# Patient Record
Sex: Female | Born: 1995
Health system: Southern US, Community
[De-identification: ages and names within clinical notes are randomized; demographics above are authoritative.]

## PROBLEM LIST (undated history)

## (undated) DIAGNOSIS — F419 Anxiety disorder, unspecified: Secondary | ICD-10-CM

## (undated) DIAGNOSIS — F329 Major depressive disorder, single episode, unspecified: Secondary | ICD-10-CM

## (undated) DIAGNOSIS — F32A Depression, unspecified: Secondary | ICD-10-CM

---
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
## 2017-03-06 ENCOUNTER — Emergency Department
Admission: EM | Admit: 2017-03-06 | Discharge: 2017-03-06 | Disposition: A | Discharge status: Home / Self Care | Payer: No Typology Code available for payment source | Attending: Dermatology | Admitting: Dermatology

## 2017-03-06 DIAGNOSIS — M545 Low back pain: Secondary | ICD-10-CM | POA: Diagnosis present

## 2017-03-06 DIAGNOSIS — R110 Nausea: Secondary | ICD-10-CM | POA: Diagnosis not present

## 2017-03-06 DIAGNOSIS — Z5321 Procedure and treatment not carried out due to patient leaving prior to being seen by health care provider: Secondary | ICD-10-CM | POA: Diagnosis not present

## 2017-03-06 HISTORY — DX: Major depressive disorder, single episode, unspecified: F32.9

## 2017-03-06 HISTORY — DX: Depression, unspecified: F32.A

## 2017-03-06 LAB — URINALYSIS, COMPLETE (UACMP) WITH MICROSCOPIC
BILIRUBIN URINE: NEGATIVE
Glucose, UA: NEGATIVE mg/dL
KETONES UR: NEGATIVE mg/dL
Nitrite: NEGATIVE
PROTEIN: 30 mg/dL — AB
Specific Gravity, Urine: 1.01 (ref 1.005–1.030)
pH: 7 (ref 5.0–8.0)

## 2017-03-06 LAB — POCT PREGNANCY, URINE: PREG TEST UR: NEGATIVE

## 2017-03-06 NOTE — ED Triage Notes (Signed)
Patient c/o lower back pain, and nausea. Patient denies urinary symptoms and possibility of pregnancy.

## 2017-03-08 ENCOUNTER — Telehealth: Payer: Self-pay | Admitting: Emergency Medicine

## 2017-03-08 NOTE — Telephone Encounter (Signed)
Called patient due to lwot to inquire about condition and follow up plans. Left message asking her to call me, as her urine analysis is abnormal and she would be advised to return or see a provider elsewhere.

## 2017-04-09 ENCOUNTER — Emergency Department (HOSPITAL_BASED_OUTPATIENT_CLINIC_OR_DEPARTMENT_OTHER): Payer: No Typology Code available for payment source

## 2017-04-09 ENCOUNTER — Encounter (HOSPITAL_BASED_OUTPATIENT_CLINIC_OR_DEPARTMENT_OTHER): Payer: Self-pay | Admitting: Emergency Medicine

## 2017-04-09 ENCOUNTER — Emergency Department (HOSPITAL_BASED_OUTPATIENT_CLINIC_OR_DEPARTMENT_OTHER)
Admission: EM | Admit: 2017-04-09 | Discharge: 2017-04-09 | Disposition: A | Discharge status: Home / Self Care | Payer: No Typology Code available for payment source | Attending: Emergency Medicine | Admitting: Emergency Medicine

## 2017-04-09 DIAGNOSIS — R109 Unspecified abdominal pain: Secondary | ICD-10-CM

## 2017-04-09 DIAGNOSIS — Z79899 Other long term (current) drug therapy: Secondary | ICD-10-CM | POA: Diagnosis not present

## 2017-04-09 DIAGNOSIS — R319 Hematuria, unspecified: Secondary | ICD-10-CM | POA: Diagnosis not present

## 2017-04-09 DIAGNOSIS — R1012 Left upper quadrant pain: Secondary | ICD-10-CM | POA: Diagnosis present

## 2017-04-09 DIAGNOSIS — N390 Urinary tract infection, site not specified: Secondary | ICD-10-CM | POA: Insufficient documentation

## 2017-04-09 LAB — URINALYSIS, MICROSCOPIC (REFLEX)

## 2017-04-09 LAB — URINALYSIS, ROUTINE W REFLEX MICROSCOPIC
Bilirubin Urine: NEGATIVE
GLUCOSE, UA: NEGATIVE mg/dL
KETONES UR: 15 mg/dL — AB
Nitrite: NEGATIVE
PH: 7.5 (ref 5.0–8.0)
Protein, ur: NEGATIVE mg/dL
Specific Gravity, Urine: 1.013 (ref 1.005–1.030)

## 2017-04-09 MED ORDER — ACETAMINOPHEN 325 MG PO TABS: 650.0000 mg | ORAL_TABLET | Freq: Four times a day (QID) | ORAL | 0 refills | Status: AC | PRN

## 2017-04-09 MED ORDER — CEPHALEXIN 500 MG PO CAPS: 500.0000 mg | ORAL_CAPSULE | Freq: Three times a day (TID) | ORAL | 0 refills | Status: AC

## 2017-04-09 MED ORDER — IBUPROFEN 600 MG PO TABS: 600.0000 mg | ORAL_TABLET | Freq: Four times a day (QID) | ORAL | 0 refills | Status: AC | PRN

## 2017-04-09 MED FILL — CEPHALEXIN 500 MG CAPSULE: 500 | 10 days supply | Qty: 30 | Fill #0

## 2017-04-09 MED FILL — IBUPROFEN 600 MG TABLET: 600 | 8 days supply | Qty: 30 | Fill #0

## 2017-04-09 MED FILL — ACETAMINOPHEN 325 MG TABLET: 325 | 13 days supply | Qty: 100 | Fill #0

## 2017-04-09 NOTE — ED Provider Notes (Signed)
[facility] Provider Note   CSN: [id number] Arrival date & time: 04/09/17  1342     History   Chief Complaint Chief Complaint  Patient presents with  . Flank Pain    HPI Annette Ortiz is a 21 y.o. female.  HPI here for evaluation of left-sided flank pain. Patient reports symptoms started yesterday as a dull left-sided backache. This morning she woke up with sharp intense pain in her left back. She reports associated nausea but denies any vomiting. She denies any fevers or chills, overt urinary symptoms, unusual vaginal bleeding or discharge. Was sent here from urgent care for further evaluation. Was given Tylenol and Zofran in urgent care and experiences significant relief.  Past Medical History:  Diagnosis Date  . Depression     There are no active problems to display for this patient.   History reviewed. No pertinent surgical history.  OB History    Gravida Para Term Preterm AB Living   0 0 0 0 0 0   SAB TAB Ectopic Multiple Live Births   0 0 0 0 0       Home Medications    Prior to Admission medications   Medication Sig Start Date End Date Taking? Authorizing Provider  FLUoxetine (PROZAC) 20 MG capsule Take 20 mg by mouth daily.   Yes [provider]  acetaminophen (TYLENOL) 325 MG tablet Take 2 tablets (650 mg total) by mouth every 6 (six) hours as needed. 04/09/17   Usama Harkless, Sharlet SalinaBenjamin, PA-C  cephALEXin (KEFLEX) 500 MG capsule Take 1 capsule (500 mg total) by mouth 3 (three) times daily. 04/09/17 04/19/17  Joycie Peekartner, Jadier Rockers, PA-C    Family History Family History  Problem Relation Age of Onset  . Seizures Mother     Social History Social History  Substance Use Topics  . Smoking status: Never Smoker  . Smokeless tobacco: Never Used  . Alcohol use No     Allergies   Patient has no known allergies.   Review of Systems Review of Systems See history of present illness  Physical Exam Updated Vital Signs BP 104/64 (BP Location:  Right Arm)   Pulse 89   Temp 99.2 F (37.3 C) (Oral)   Resp 18   Ht 5\' 7"  (1.702 m)   Wt 68 kg (150 lb)   LMP 03/13/2017 (Approximate)   SpO2 100%   BMI 23.49 kg/m   Physical Exam  Constitutional: She appears well-developed. No distress.  Awake, alert and nontoxic in appearance  HENT:  Head: Normocephalic and atraumatic.  Right Ear: External ear normal.  Left Ear: External ear normal.  Mouth/Throat: Oropharynx is clear and moist.  Eyes: Conjunctivae and EOM are normal. Pupils are equal, round, and reactive to light.  Neck: Normal range of motion. No JVD present.  Cardiovascular: Normal rate, regular rhythm and normal heart sounds.   Pulmonary/Chest: Effort normal and breath sounds normal. No stridor.  Abdominal: Soft. There is tenderness.  Mild tenderness in left lower quadrant, somewhat diffusely about left flank. Minimal left CVA tenderness. No rebound or guarding. No peritoneal signs.  Musculoskeletal: Normal range of motion.  Neurological:  Awake, alert, cooperative and aware of situation; motor strength bilaterally; sensation normal to light touch bilaterally; no facial asymmetry; tongue midline; major cranial nerves appear intact;  baseline gait without new ataxia.  Skin: No rash noted. She is not diaphoretic.  Psychiatric: She has a normal mood and affect. Her behavior is normal. Thought content normal.  Nursing note and vitals  reviewed.    ED Treatments / Results  Labs (all labs ordered are listed, but only abnormal results are displayed) Labs Reviewed  URINALYSIS, ROUTINE W REFLEX MICROSCOPIC - Abnormal; Notable for the following:       Result Value   APPearance CLOUDY (*)    Hgb urine dipstick TRACE (*)    Ketones, ur 15 (*)    Leukocytes, UA MODERATE (*)    All other components within normal limits  URINALYSIS, MICROSCOPIC (REFLEX) - Abnormal; Notable for the following:    Bacteria, UA MANY (*)    Squamous Epithelial / LPF 0-5 (*)    All other components  within normal limits  URINE CULTURE    EKG  EKG Interpretation None       Radiology US Renal  Result Date: 04/09/2017 CLINICAL DATA:  21 year old female with acute left flank pain for 1 day. EXAM: RENAL / URINARY TRACT ULTRASOUND COMPLETE COMPARISON:  None. FINDINGS: Right Kidney: Length: 10.1 cm. Echogenicity within normal limits. No mass or hydronephrosis visualized. Left Kidney: Length: 11.2 cm. Echogenicity within normal limits. No mass or hydronephrosis visualized. Bladder: Appears normal for degree of bladder distention. Normal bilateral ureteral jets are identified. IMPRESSION: Normal renal ultrasound. Electronically Signed   By: Harmon Pier M.D.   On: 04/09/2017 16:16    Procedures Procedures (including critical care time)  Medications Ordered in ED Medications - No data to display   Initial Impression / Assessment and Plan / ED Course  I have reviewed the triage vital signs and the nursing notes.  Pertinent labs & imaging results that were available during my care of the patient were reviewed by me and considered in my medical decision making (see chart for details).     Patient sent from urgent care for evaluation of left flank pain. On exam she does have mild left CVA tenderness and some tenderness to palpation and left flank. Urinalysis shows evidence of infection. Pregnancy test completed at urgent care was negative today. Discussed risks and benefits associated with CT scan versus ultrasound with mom and patient, they prefer ultrasound at this time. Renal ultrasound is negative for any stone or other emergent pathology. Will treat empirically for possible pyelonephritis with Keflex 10 days. Urine culture obtained. Overall she appears very well, nontoxic, hemodynamically stable and appropriate for outpatient follow-up. She voices no other questions or concerns prior to discharge. Discussed follow-up with PCP. Return precautions discussed.  Final Clinical Impressions(s) /  ED Diagnoses   Final diagnoses:  Left flank pain  Urinary tract infection with hematuria, site unspecified    New Prescriptions New Prescriptions   ACETAMINOPHEN (TYLENOL) 325 MG TABLET    Take 2 tablets (650 mg total) by mouth every 6 (six) hours as needed.   CEPHALEXIN (KEFLEX) 500 MG CAPSULE    Take 1 capsule (500 mg total) by mouth 3 (three) times daily.   IBUPROFEN (ADVIL,MOTRIN) 600 MG TABLET    Take 1 tablet (600 mg total) by mouth every 6 (six) hours as needed.     Joycie Peek, PA-C 04/09/17 1638    Melene Plan, DO 04/10/17 [phone]

## 2017-04-09 NOTE — ED Triage Notes (Addendum)
Patient reports left flank pain since yesterday afternoon.  Reports hematuria, nausea. Denies dysuria.  Referred from [facility] urgent care for CT scan.  Received 4mg  zofran ODT and 1000mg  tylenol PO from urgent care.

## 2017-04-09 NOTE — Discharge Instructions (Signed)
Your evaluation in the emergency Department is complete. Your symptoms are likely due to a UTI. There is no evidence of kidney stone. Please take your antibiotics as prescribed. Take all of your antibiotics and do not save or share them. You may continue taking Tylenol and ibuprofen to help with her symptoms. Be sure to drink lots of fluids as we discussed. Follow up with your doctor for reevaluation. Return to ED for new or worsening symptoms as we discussed.

## 2017-04-10 LAB — URINE CULTURE

## 2017-04-11 ENCOUNTER — Encounter (HOSPITAL_BASED_OUTPATIENT_CLINIC_OR_DEPARTMENT_OTHER): Payer: Self-pay | Admitting: Emergency Medicine

## 2017-04-11 ENCOUNTER — Telehealth: Payer: Self-pay

## 2017-04-11 ENCOUNTER — Emergency Department (HOSPITAL_BASED_OUTPATIENT_CLINIC_OR_DEPARTMENT_OTHER): Payer: No Typology Code available for payment source

## 2017-04-11 ENCOUNTER — Emergency Department (HOSPITAL_BASED_OUTPATIENT_CLINIC_OR_DEPARTMENT_OTHER)
Admission: EM | Admit: 2017-04-11 | Discharge: 2017-04-12 | Disposition: A | Discharge status: Home / Self Care | Payer: No Typology Code available for payment source | Attending: Emergency Medicine | Admitting: Emergency Medicine

## 2017-04-11 DIAGNOSIS — N12 Tubulo-interstitial nephritis, not specified as acute or chronic: Secondary | ICD-10-CM

## 2017-04-11 DIAGNOSIS — N200 Calculus of kidney: Secondary | ICD-10-CM | POA: Insufficient documentation

## 2017-04-11 DIAGNOSIS — R1110 Vomiting, unspecified: Secondary | ICD-10-CM | POA: Diagnosis present

## 2017-04-11 LAB — CBC WITH DIFFERENTIAL/PLATELET
BASOS ABS: 0 10*3/uL (ref 0.0–0.1)
BASOS PCT: 0 %
Eosinophils Absolute: 0 10*3/uL (ref 0.0–0.7)
Eosinophils Relative: 0 %
HCT: 37.4 % (ref 36.0–46.0)
Hemoglobin: 12.8 g/dL (ref 12.0–15.0)
Lymphocytes Relative: 9 %
Lymphs Abs: 0.8 10*3/uL (ref 0.7–4.0)
MCH: 29.6 pg (ref 26.0–34.0)
MCHC: 34.2 g/dL (ref 30.0–36.0)
MCV: 86.4 fL (ref 78.0–100.0)
MONO ABS: 0.7 10*3/uL (ref 0.1–1.0)
Monocytes Relative: 7 %
NEUTROS ABS: 8.1 10*3/uL — AB (ref 1.7–7.7)
Neutrophils Relative %: 84 %
PLATELETS: 226 10*3/uL (ref 150–400)
RBC: 4.33 MIL/uL (ref 3.87–5.11)
RDW: 12.9 % (ref 11.5–15.5)
WBC: 9.6 10*3/uL (ref 4.0–10.5)

## 2017-04-11 LAB — URINALYSIS, ROUTINE W REFLEX MICROSCOPIC
Bilirubin Urine: NEGATIVE
GLUCOSE, UA: NEGATIVE mg/dL
Hgb urine dipstick: NEGATIVE
Ketones, ur: >80 mg/dL — AB
Nitrite: NEGATIVE
PH: 6.5 (ref 5.0–8.0)
PROTEIN: NEGATIVE mg/dL
SPECIFIC GRAVITY, URINE: 1.011 (ref 1.005–1.030)

## 2017-04-11 LAB — COMPREHENSIVE METABOLIC PANEL
ALBUMIN: 3.7 g/dL (ref 3.5–5.0)
ALT: 18 U/L (ref 14–54)
AST: 21 U/L (ref 15–41)
Alkaline Phosphatase: 68 U/L (ref 38–126)
Anion gap: 12 (ref 5–15)
BUN: 9 mg/dL (ref 6–20)
CHLORIDE: 99 mmol/L — AB (ref 101–111)
CO2: 21 mmol/L — AB (ref 22–32)
Calcium: 8.9 mg/dL (ref 8.9–10.3)
Creatinine, Ser: 0.68 mg/dL (ref 0.44–1.00)
GFR calc Af Amer: >60 mL/min (ref >60)
GFR calc non Af Amer: >60 mL/min (ref >60)
GLUCOSE: 81 mg/dL (ref 65–99)
POTASSIUM: 3.7 mmol/L (ref 3.5–5.1)
Sodium: 132 mmol/L — ABNORMAL LOW (ref 135–145)
Total Bilirubin: 0.7 mg/dL (ref 0.3–1.2)
Total Protein: 7.2 g/dL (ref 6.5–8.1)

## 2017-04-11 LAB — LIPASE, BLOOD: Lipase: 15 U/L (ref 11–51)

## 2017-04-11 LAB — URINALYSIS, MICROSCOPIC (REFLEX): RBC / HPF: NONE SEEN RBC/hpf (ref 0–5)

## 2017-04-11 LAB — PREGNANCY, URINE: Preg Test, Ur: NEGATIVE

## 2017-04-11 MED ORDER — CIPROFLOXACIN IN D5W 400 MG/200ML IV SOLN
400.0000 mg | Freq: Once | INTRAVENOUS | Status: AC
Start: 2017-04-11 — End: 2017-04-11
  Administered 2017-04-11: 400 mg via INTRAVENOUS
  Filled 2017-04-11: qty 200

## 2017-04-11 MED ORDER — ONDANSETRON HCL 4 MG/2ML IJ SOLN
4.0000 mg | Freq: Once | INTRAMUSCULAR | Status: AC
  Administered 2017-04-11: 4 mg via INTRAVENOUS
  Filled 2017-04-11: qty 2

## 2017-04-11 MED ORDER — IOPAMIDOL (ISOVUE-300) INJECTION 61%
100.0000 mL | Freq: Once | INTRAVENOUS | Status: AC | PRN
  Administered 2017-04-11: 100 mL via INTRAVENOUS

## 2017-04-11 MED ORDER — CIPROFLOXACIN HCL 500 MG PO TABS: 500.0000 mg | ORAL_TABLET | Freq: Two times a day (BID) | ORAL | 0 refills | Status: DC

## 2017-04-11 MED ORDER — KETOROLAC TROMETHAMINE 15 MG/ML IJ SOLN
15.0000 mg | Freq: Once | INTRAMUSCULAR | Status: AC
Start: 2017-04-11 — End: 2017-04-11
  Administered 2017-04-11: 15 mg via INTRAVENOUS
  Filled 2017-04-11: qty 1

## 2017-04-11 MED ORDER — LIDOCAINE 4 % EX CREA
TOPICAL_CREAM | Freq: Once | CUTANEOUS | Status: AC
  Administered 2017-04-11: 20:00:00 via TOPICAL
  Filled 2017-04-11: qty 5

## 2017-04-11 MED ORDER — SODIUM CHLORIDE 0.9 % IV BOLUS (SEPSIS)
1000.0000 mL | Freq: Once | INTRAVENOUS | Status: AC
  Administered 2017-04-11: 1000 mL via INTRAVENOUS

## 2017-04-11 MED ORDER — TRAMADOL HCL 50 MG PO TABS: 50.0000 mg | ORAL_TABLET | Freq: Four times a day (QID) | ORAL | 0 refills | Status: AC | PRN

## 2017-04-11 MED ORDER — ONDANSETRON HCL 8 MG PO TABS: 8.0000 mg | ORAL_TABLET | Freq: Three times a day (TID) | ORAL | 0 refills | Status: AC | PRN

## 2017-04-11 NOTE — ED Triage Notes (Signed)
Patient states that shw was treated her on firday and has not had any vomiting since the zofran on Friday. The patient reports that she started to have vomiting again today. Patient is having darker foul smelling stools. She has pain to her left ear

## 2017-04-11 NOTE — ED Provider Notes (Signed)
[facility] Provider Note   CSN: [id number] Arrival date & time: 04/11/17  1833  By signing my name below, I, Linna DarnerRussell Turner, attest that this documentation has been prepared under the direction and in the presence of Karlis Cregg, PA-C. Electronically Signed: Linna Darnerussell Turner, Scribe. 04/11/2017. 7:39 PM.  History   Chief Complaint Chief Complaint  Patient presents with  . Emesis   The history is provided by the patient. No language interpreter was used.    HPI Comments: Annette Ortiz is a 21 y.o. female who presents to the Emergency Department complaining of constant, waxing and waning left flank pain beginning two days ago. She reports some associated nausea. Patient was evaluated here two days ago for the same and was discharged with Tylenol, Motrin, and Keflex which have not improved her symptoms. Her urine culture from the visit on 7/6 was contaminated and her renal ultrasound was normal. This morning, patient developed some persistent green-tinted vomiting and has been unable to tolerate PO intake. She also notes that her bowel movements have been very dark and unusually malodorous today. She takes fluoxetine for depression but has been non-compliant lately. Patient denies urinary retention, dysuria, fevers, chills, or any other associated symptoms.  Past Medical History:  Diagnosis Date  . Depression     There are no active problems to display for this patient.   History reviewed. No pertinent surgical history.  OB History    Gravida Para Term Preterm AB Living   0 0 0 0 0 0   SAB TAB Ectopic Multiple Live Births   0 0 0 0 0       Home Medications    Prior to Admission medications   Medication Sig Start Date End Date Taking? Authorizing Provider  acetaminophen (TYLENOL) 325 MG tablet Take 2 tablets (650 mg total) by mouth every 6 (six) hours as needed. 04/09/17   Cartner, Sharlet SalinaBenjamin, PA-C  cephALEXin (KEFLEX) 500 MG capsule Take 1 capsule (500 mg total)  by mouth 3 (three) times daily. 04/09/17 04/19/17  Cartner, Sharlet SalinaBenjamin, PA-C  FLUoxetine (PROZAC) 20 MG capsule Take 20 mg by mouth daily.    [provider]  ibuprofen (ADVIL,MOTRIN) 600 MG tablet Take 1 tablet (600 mg total) by mouth every 6 (six) hours as needed. 04/09/17   Joycie Peekartner, Benjamin, PA-C    Family History Family History  Problem Relation Age of Onset  . Seizures Mother     Social History Social History  Substance Use Topics  . Smoking status: Never Smoker  . Smokeless tobacco: Never Used  . Alcohol use No     Allergies   Patient has no known allergies.   Review of Systems Review of Systems  Constitutional: Negative for chills and fever.  Gastrointestinal: Positive for abdominal pain, nausea and vomiting.       Positive for melena.  Genitourinary: Positive for flank pain (L). Negative for difficulty urinating, dysuria, hematuria, vaginal bleeding, vaginal discharge and vaginal pain.  All other systems reviewed and are negative.  Physical Exam Updated Vital Signs BP (!) 104/34 (BP Location: Right Arm)   Pulse 96   Temp 99.3 F (37.4 C) (Oral)   Resp 18   LMP 03/13/2017 (Approximate)   SpO2 99%   Physical Exam  Constitutional: She is oriented to person, place, and time. She appears well-developed and well-nourished. No distress.  HENT:  Head: Normocephalic and atraumatic.  Eyes: Conjunctivae and EOM are normal.  Neck: Neck supple. No tracheal deviation present.  Cardiovascular:  Normal rate.   Pulmonary/Chest: Effort normal. No respiratory distress.  Abdominal: Soft. She exhibits no distension and no mass. There is tenderness. There is no guarding.  Left CVA tenderness  Musculoskeletal: Normal range of motion.  Neurological: She is alert and oriented to person, place, and time.  Skin: Skin is warm and dry.  Psychiatric: She has a normal mood and affect. Her behavior is normal.  Nursing note and vitals reviewed.  ED Treatments / Results  Labs (all  labs ordered are listed, but only abnormal results are displayed) Labs Reviewed  CBC WITH DIFFERENTIAL/PLATELET - Abnormal; Notable for the following:       Result Value   Neutro Abs 8.1 (*)    All other components within normal limits  COMPREHENSIVE METABOLIC PANEL - Abnormal; Notable for the following:    Sodium 132 (*)    Chloride 99 (*)    CO2 21 (*)    All other components within normal limits  URINALYSIS, ROUTINE W REFLEX MICROSCOPIC - Abnormal; Notable for the following:    Ketones, ur >80 (*)    Leukocytes, UA TRACE (*)    All other components within normal limits  URINALYSIS, MICROSCOPIC (REFLEX) - Abnormal; Notable for the following:    Bacteria, UA RARE (*)    Squamous Epithelial / LPF 0-5 (*)    All other components within normal limits  URINE CULTURE  LIPASE, BLOOD  PREGNANCY, URINE    EKG  EKG Interpretation None       Radiology Ct Abdomen Pelvis W Contrast  Result Date: 04/11/2017 CLINICAL DATA:  Vomiting left flank pain EXAM: CT ABDOMEN AND PELVIS WITH CONTRAST TECHNIQUE: Multidetector CT imaging of the abdomen and pelvis was performed using the standard protocol following bolus administration of intravenous contrast. CONTRAST:  ISOVUE-300 IOPAMIDOL (ISOVUE-300) INJECTION 61% COMPARISON:  None. FINDINGS: Lower chest: Lung bases demonstrate no acute consolidation or pleural effusion. Normal heart size. Hepatobiliary: No focal liver abnormality is seen. No gallstones, gallbladder wall thickening, or biliary dilatation. Pancreas: Unremarkable. No pancreatic ductal dilatation or surrounding inflammatory changes. Spleen: Normal in size without focal abnormality. Adrenals/Urinary Tract: Adrenal glands are within normal limits. The right kidney is unremarkable. Patchy/ striated nephrogram on the left with multifocal cortical hypodensities in the left kidney. No hydronephrosis. The bladder is normal. Stomach/Bowel: Stomach is within normal limits. Appendix appears  normal. No evidence of bowel wall thickening, distention, or inflammatory changes. Vascular/Lymphatic: Non aneurysmal aorta. Subcentimeter right lower quadrant mesenteric lymph nodes Reproductive: Prominent endometrial stripe which may be correlated with phase of menses. No adnexal masses Other: Trace free fluid in the pelvis.  No free air. Musculoskeletal: No acute or significant osseous findings. IMPRESSION: 1. Patchy cortical hypodensities within the left kidney, CT appearance is suggestive of pyelonephritis. Suggest correlation with urinalysis. 2. Negative for bowel obstruction or bowel wall thickening. Normal appendix. 3. Small amount of free fluid in the pelvis Electronically Signed   By: Jasmine Pang M.D.   On: 04/11/2017 22:02    Procedures Procedures (including critical care time)  DIAGNOSTIC STUDIES: Oxygen Saturation is 99% on RA, normal by my interpretation.    COORDINATION OF CARE: 7:39 PM Discussed treatment plan with pt at bedside and pt agreed to plan.  Medications Ordered in ED Medications - No data to display   Initial Impression / Assessment and Plan / ED Course  I have reviewed the triage vital signs and the nursing notes.  Pertinent labs & imaging results that were available during my care  of the patient were reviewed by me and considered in my medical decision making (see chart for details).     Pt in ED with continued left flank pain, now with n/v. Exam is positive for left upper quadrant tenderness and left CVA tenderness. She is afebrile, vital signs are normal. Will check labs, urinalysis, give Zofran IV fluids.   Patient's labs with no significant abnormality. Urinalysis showing improving infection. Recent cultures from 2 days ago showed multiple organisms, no predominant. We will send culture of today urine as well. We will get CT abdomen and pelvis for further evaluation.   Patient's CT scan did show left pyelonephritis. This is consistent with her history  and exam. Will switch to Cipro, given a dose of IV Cipro in emergency department. We will give an oral trial.   Pt tolerating oral fluids with no difficulty. Feels much better.  Plan to dc home. Pt requesting stronger medication for pain, currently taking tylenol. Will give tramadol. Will have her follow up with pcp. Return precautions discussed.   Vitals:   04/11/17 2245 04/11/17 2300 04/11/17 2315 04/11/17 2330  BP: (!) 102/57 (!) 104/53 100/66 101/63  Pulse: 64 79 80 (!) 55  Resp:      Temp:      TempSrc:      SpO2: 100% 100% 100% 100%    Final Clinical Impressions(s) / ED Diagnoses   Final diagnoses:  Pyelonephritis    New Prescriptions New Prescriptions   CIPROFLOXACIN (CIPRO) 500 MG TABLET    Take 1 tablet (500 mg total) by mouth 2 (two) times daily.   ONDANSETRON (ZOFRAN) 8 MG TABLET    Take 1 tablet (8 mg total) by mouth every 8 (eight) hours as needed for nausea or vomiting.   TRAMADOL (ULTRAM) 50 MG TABLET    Take 1 tablet (50 mg total) by mouth every 6 (six) hours as needed.   I personally performed the services described in this documentation, which was scribed in my presence. The recorded information has been reviewed and is accurate.     Jaynie Crumble, PA-C 04/11/17 2359    Jacalyn Lefevre, MD 04/12/17 [phone]

## 2017-04-11 NOTE — ED Notes (Signed)
EDPA into room, prior to RN assessment, see PA notes, pending orders.   

## 2017-04-11 NOTE — ED Notes (Signed)
Alert, NAD, calm, interactive, resps e/u, speaking in clear complete sentences, no dyspnea noted, skin W&D[facility]  Pt also anxious and beginning to hyperventilate about the thought of IV, blood draw and needle. Encouraged, reassured, procedure explained, attempted coaching and calming measures. Lidocaine cream placed to pt's IV site of choice.

## 2017-04-11 NOTE — Telephone Encounter (Signed)
No further treatment for [facility] ED 04/09/17 per [place] HunAllison Masters Pharm D

## 2017-04-11 NOTE — ED Notes (Signed)
C/o pain and nausea returning.

## 2017-04-11 NOTE — ED Notes (Signed)
Alert, NAD, calm, interactive, resps e/u, speaking in clear complete sentences, no dyspnea noted, skin W&D, VSS, c/o HA, back and abd pain, also nausea, (denies: sob, dizziness or visual changes). [facility]

## 2017-04-11 NOTE — Discharge Instructions (Signed)
Cirpo as prescribed until all gone for infection. Take zofran as prescribed as needed for nausea and vomiting. Take ibuprofen or tylenol for mild pain. Take tramadol for severe pain. Follow up with family doctor to make sure you are improving in 1 week. Return if worsening or cannot keep medications down.
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
## 2017-04-11 NOTE — ED Notes (Signed)
Back from CT. No changes.

## 2017-04-13 LAB — URINE CULTURE: Culture: NO GROWTH

## 2018-03-08 ENCOUNTER — Encounter

## 2019-05-15 ENCOUNTER — Ambulatory Visit
Admission: EM | Admit: 2019-05-15 | Discharge: 2019-05-15 | Disposition: A | Discharge status: Home / Self Care | Payer: No Typology Code available for payment source | Attending: Family Medicine | Admitting: Family Medicine

## 2019-05-15 ENCOUNTER — Ambulatory Visit: Payer: No Typology Code available for payment source

## 2019-05-15 ENCOUNTER — Encounter: Payer: Self-pay | Admitting: Emergency Medicine

## 2019-05-15 ENCOUNTER — Other Ambulatory Visit: Payer: Self-pay

## 2019-05-15 DIAGNOSIS — R51 Headache: Secondary | ICD-10-CM | POA: Insufficient documentation

## 2019-05-15 DIAGNOSIS — S39012A Strain of muscle, fascia and tendon of lower back, initial encounter: Secondary | ICD-10-CM

## 2019-05-15 DIAGNOSIS — S161XXA Strain of muscle, fascia and tendon at neck level, initial encounter: Secondary | ICD-10-CM | POA: Diagnosis not present

## 2019-05-15 DIAGNOSIS — R519 Headache, unspecified: Secondary | ICD-10-CM

## 2019-05-15 HISTORY — DX: Anxiety disorder, unspecified: F41.9

## 2019-05-15 LAB — PREGNANCY, URINE: Preg Test, Ur: NEGATIVE
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
## 2019-07-06 ENCOUNTER — Other Ambulatory Visit: Payer: Self-pay

## 2019-07-06 DIAGNOSIS — Z20822 Contact with and (suspected) exposure to covid-19: Secondary | ICD-10-CM

## 2019-07-07 LAB — NOVEL CORONAVIRUS, NAA: SARS-CoV-2, NAA: DETECTED — AB

## 2020-10-19 IMAGING — CR LUMBAR SPINE - COMPLETE 4+ VIEW
5 series · 5 of 5 positions shown · non-contrast
Comparison: None.

CLINICAL DATA: Pain

EXAM:
LUMBAR SPINE - COMPLETE 4+ VIEW

[l-spine ap]
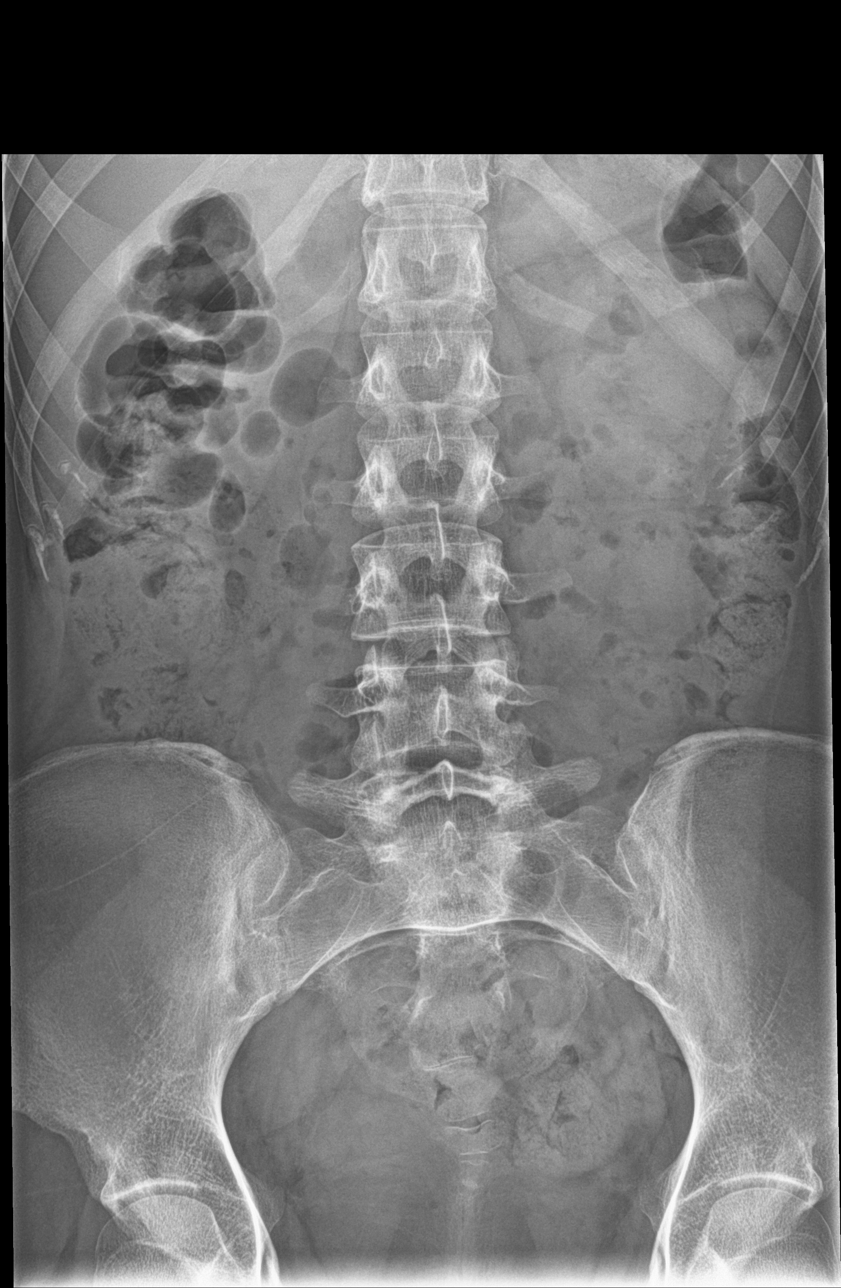

[l-spine obl (1 of 2)]
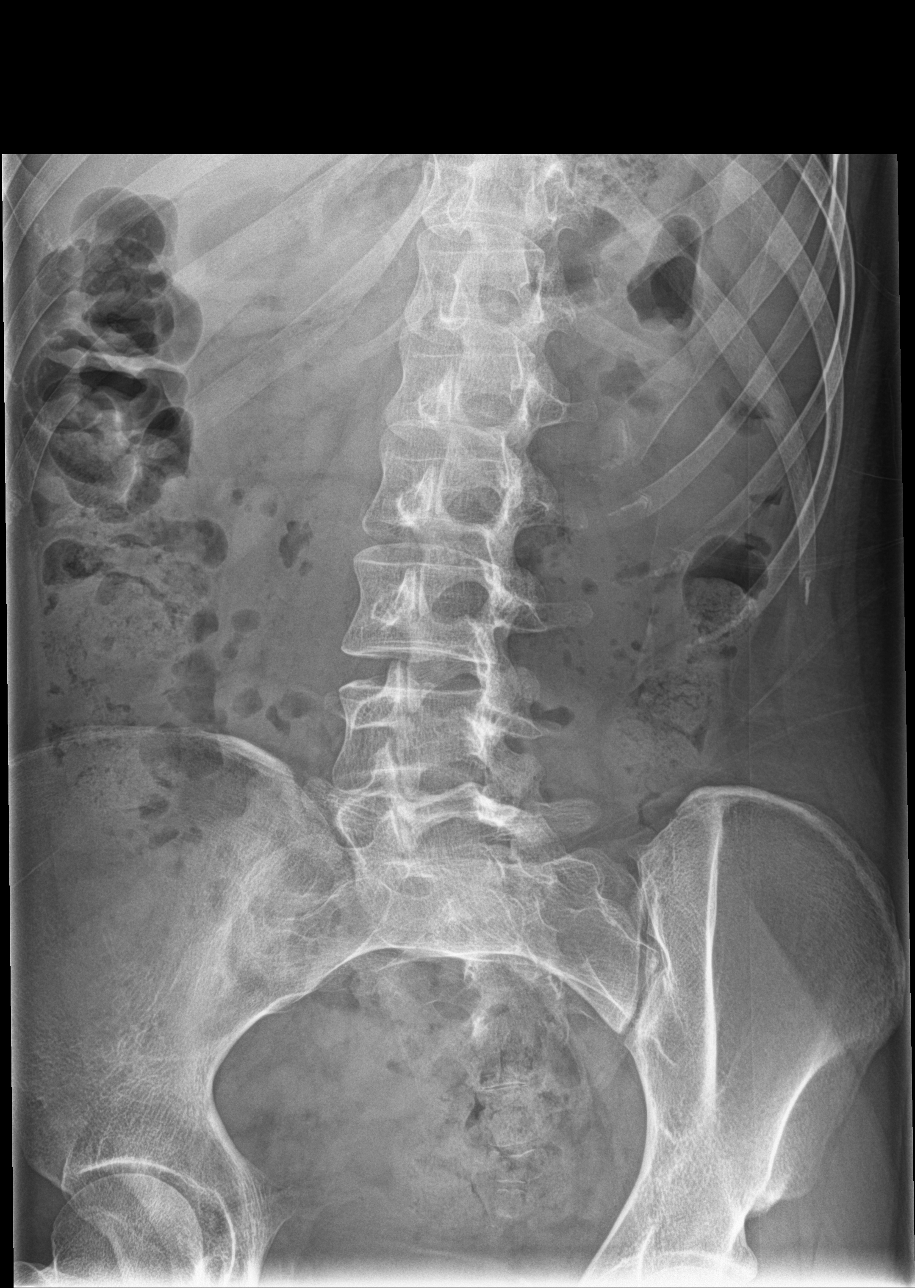

[l-spine obl (2 of 2)]
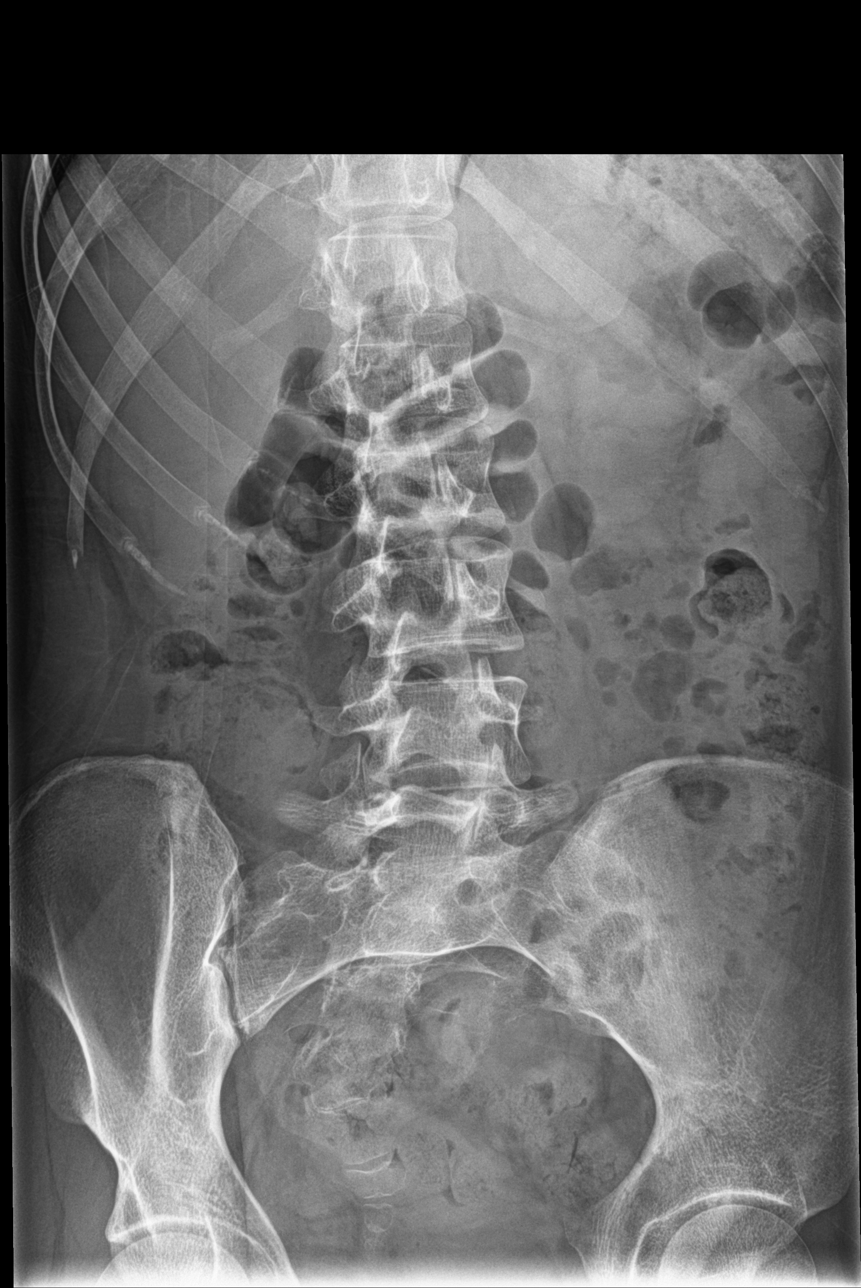

[l-spine lat]
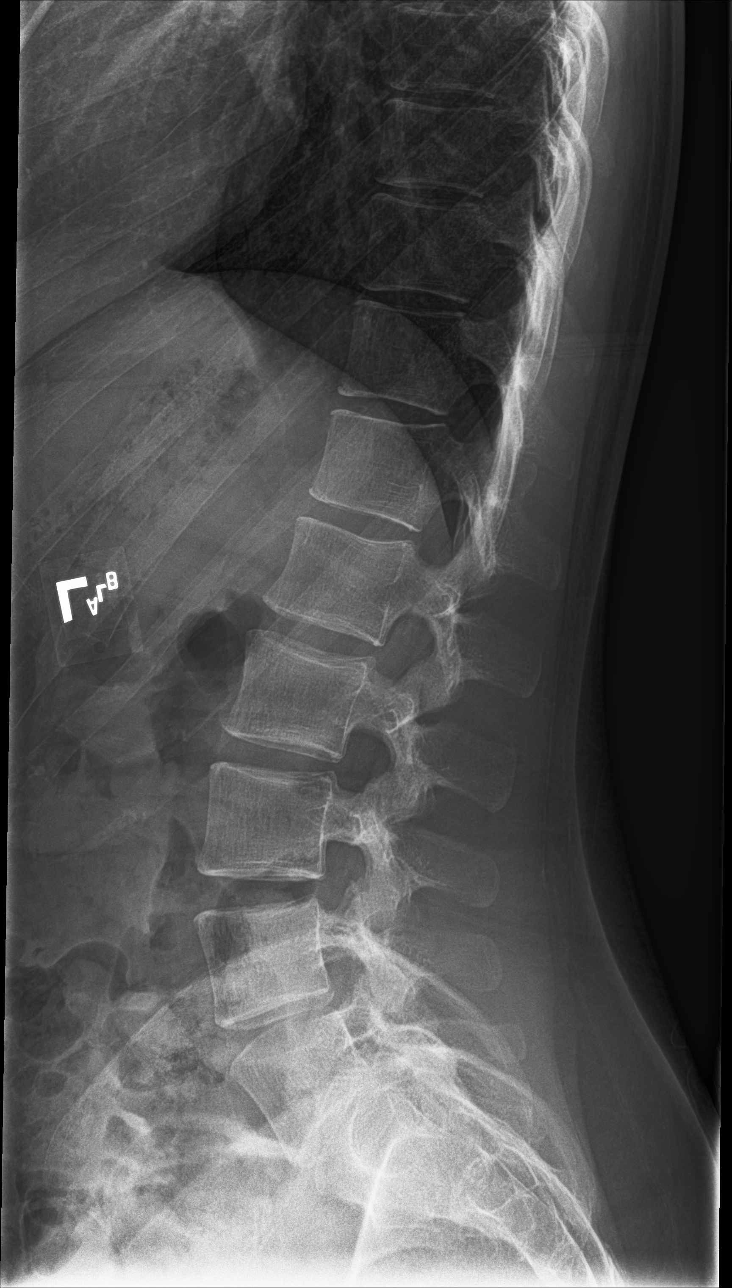

[l-spine spot]
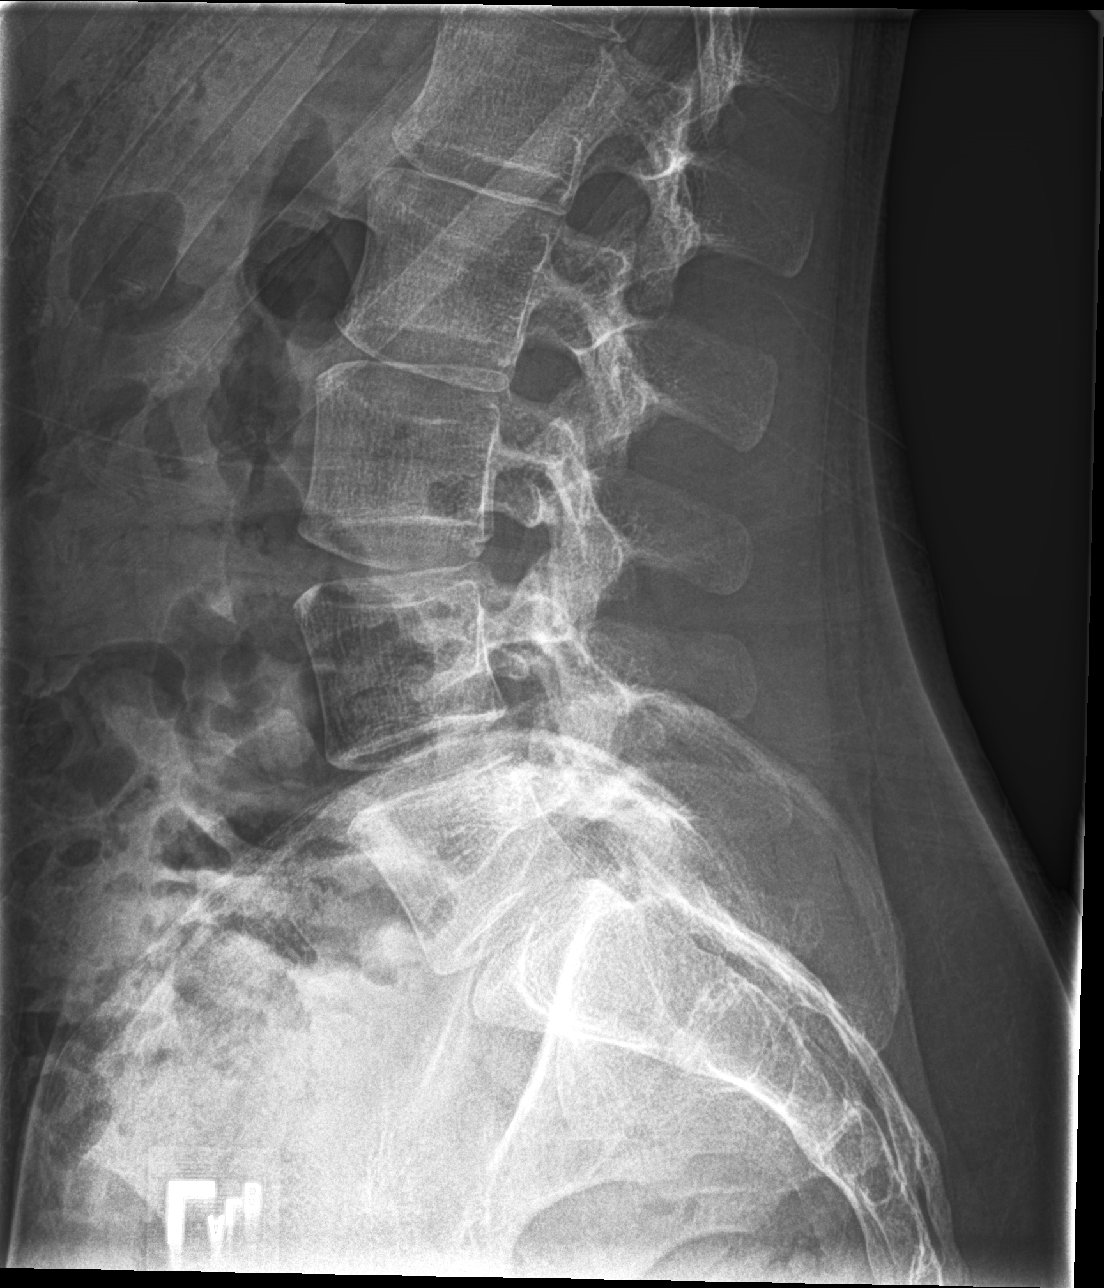

[5 of 5 positions shown; findings below may reference images not displayed]

FINDINGS: There is no evidence of lumbar spine fracture. Alignment is normal.
Intervertebral disc spaces are maintained.
IMPRESSION: Negative.

## 2021-09-19 ENCOUNTER — Emergency Department
Admission: EM | Admit: 2021-09-19 | Discharge: 2021-09-19 | Disposition: A | Discharge status: Home / Self Care | Payer: Medicaid Other | Attending: Emergency Medicine | Admitting: Emergency Medicine

## 2021-09-19 ENCOUNTER — Other Ambulatory Visit: Payer: Self-pay

## 2021-09-19 DIAGNOSIS — J069 Acute upper respiratory infection, unspecified: Secondary | ICD-10-CM | POA: Insufficient documentation

## 2021-09-19 DIAGNOSIS — H66002 Acute suppurative otitis media without spontaneous rupture of ear drum, left ear: Secondary | ICD-10-CM | POA: Insufficient documentation

## 2021-09-19 DIAGNOSIS — L0291 Cutaneous abscess, unspecified: Secondary | ICD-10-CM

## 2021-09-19 DIAGNOSIS — N764 Abscess of vulva: Secondary | ICD-10-CM | POA: Insufficient documentation

## 2021-09-19 MED ORDER — CEFDINIR 300 MG PO CAPS
300.0000 mg | ORAL_CAPSULE | Freq: Two times a day (BID) | ORAL | Status: DC
  Administered 2021-09-19: 300 mg via ORAL
  Filled 2021-09-19: qty 1

## 2021-09-19 MED ORDER — IBUPROFEN 800 MG PO TABS
800.0000 mg | ORAL_TABLET | Freq: Once | ORAL | Status: DC
  Filled 2021-09-19: qty 1

## 2021-09-19 MED ORDER — CEFDINIR 300 MG PO CAPS: 300.0000 mg | ORAL_CAPSULE | Freq: Two times a day (BID) | ORAL | 0 refills | Status: AC

## 2021-09-19 MED ORDER — ACETAMINOPHEN 500 MG PO TABS
1000.0000 mg | ORAL_TABLET | Freq: Once | ORAL | Status: AC
  Administered 2021-09-19: 1000 mg via ORAL
  Filled 2021-09-19: qty 2

## 2021-09-19 NOTE — Discharge Instructions (Signed)
Apply warm compress or soak the abscess in warm water several times a day. Then apply topical bacitracin over it. Take antibiotics as prescribed. Ibuprofen 800mg  every 6 hours for pain.  Return to the emergency room for fever, increased size of the abscess, worsening ear pain, drainage from the ear.  Otherwise follow-up with primary care doctor in 3 days

## 2021-09-19 NOTE — ED Triage Notes (Signed)
Patient ambulatory to triage with steady gait, without difficulty, tearful; st awoke with left earache; recent cold symptoms

## 2021-09-19 NOTE — ED Provider Notes (Signed)
[facility] Emergency Department Provider Note  ____________________________________________  Time seen: Approximately 5:19 AM  I have reviewed the triage vital signs and the nursing notes.   HISTORY  Chief Complaint Otalgia   HPI Annette Ortiz is a 25 y.o. female who presents for evaluation of left ear pain.  Patient reports that she has had a cough and congestion for the last 2 weeks.  This morning she woke up with severe sharp stabbing left ear pain.  Patient is accompanied by her mother who says patient has had several prior infections in her ear as a child including perforation of her tympanic membrane.  The mom was concerned that she had perforated her tympanic membrane again.  Patient did not take anything at home for the pain.  She is also complaining of a vaginal boil that she has had since yesterday.  No fever or chills.  Past Medical History:  Diagnosis Date   Anxiety    Depression     There are no problems to display for this patient.   History reviewed. No pertinent surgical history.  Prior to Admission medications   Medication Sig Start Date End Date Taking? Authorizing Provider  acetaminophen (TYLENOL) 325 MG tablet Take 2 tablets (650 mg total) by mouth every 6 (six) hours as needed. 04/09/17   Cartner, Sharlet Salina, PA-C  ciprofloxacin (CIPRO) 500 MG tablet Take 1 tablet (500 mg total) by mouth 2 (two) times daily. 04/11/17   Kirichenko, Tatyana, PA-C  FLUoxetine (PROZAC) 20 MG capsule Take 20 mg by mouth daily.    [provider]  ibuprofen (ADVIL,MOTRIN) 600 MG tablet Take 1 tablet (600 mg total) by mouth every 6 (six) hours as needed. 04/09/17   Cartner, Sharlet Salina, PA-C  ondansetron (ZOFRAN) 8 MG tablet Take 1 tablet (8 mg total) by mouth every 8 (eight) hours as needed for nausea or vomiting. 04/11/17   Kirichenko, Tatyana, PA-C  traMADol (ULTRAM) 50 MG tablet Take 1 tablet (50 mg total) by mouth every 6 (six) hours as needed. 04/11/17    Jaynie Crumble, PA-C    Allergies Patient has no known allergies.  Family History  Problem Relation Age of Onset   Seizures Mother    Thyroid disease Father    Seizures Brother     Social History Social History   Tobacco Use   Smoking status: Never   Smokeless tobacco: Never  Vaping Use   Vaping Use: Every day  Substance Use Topics   Alcohol use: No   Drug use: No    Review of Systems  Constitutional: Negative for fever. Eyes: Negative for visual changes. ENT: Negative for sore throat. + cough, congestion, ear pain Neck: No neck pain  Cardiovascular: Negative for chest pain. Respiratory: Negative for shortness of breath. Gastrointestinal: Negative for abdominal pain, vomiting or diarrhea. Genitourinary: Negative for dysuria. + vaginal abscess Musculoskeletal: Negative for back pain. Skin: Negative for rash. Neurological: Negative for headaches, weakness or numbness. Psych: No SI or HI  ____________________________________________   PHYSICAL EXAM:  VITAL SIGNS: ED Triage Vitals  Enc Vitals Group     BP 09/19/21 0506 115/71     Pulse Rate 09/19/21 0506 83     Resp 09/19/21 0506 (!) 24     Temp 09/19/21 0506 97.6 F (36.4 C)     Temp Source 09/19/21 0506 Oral     SpO2 09/19/21 0506 100 %     Weight 09/19/21 0504 145 lb (65.8 kg)     Height  09/19/21 0504 5\' 8"  (1.727 m)     Head Circumference --      Peak Flow --      Pain Score 09/19/21 0503 10     Pain Loc --      Pain Edu? --      Excl. in [place]? --     Constitutional: Alert and oriented. Well appearing and in no apparent distress. HEENT:      Head: Normocephalic and atraumatic.         Eyes: Conjunctivae are normal. Sclera is non-icteric.       Mouth/Throat: Mucous membranes are moist.       Ear: Bulging and erythema of the L TM, TM is intact      Neck: Supple with no signs of meningismus. Cardiovascular: Regular rate and rhythm.  Respiratory: Normal respiratory effort.  Genitourinary: No  CVA tenderness. Musculoskeletal:  1cm induration in the R labia Neurologic: Normal speech and language. Face is symmetric. Moving all extremities. No gross focal neurologic deficits are appreciated. Skin: Skin is warm, dry and intact. No rash noted. Psychiatric: Mood and affect are normal. Speech and behavior are normal.  ____________________________________________   LABS (all labs ordered are listed, but only abnormal results are displayed)  Labs Reviewed - No data to display ____________________________________________  EKG  none  ____________________________________________  RADIOLOGY  none  ____________________________________________   PROCEDURES  Procedure(s) performed: None Procedures   Critical Care performed:  None ____________________________________________   INITIAL IMPRESSION / ASSESSMENT AND PLAN / ED COURSE  25 y.o. female who presents for evaluation of left ear pain and vaginal boil.   Patient with a viral upper respiratory infection with congestion and cough.  Has bulging and erythema of the left TM, TM is intact.  Also has a very small 1 cm abscess in the vaginal labia.  Will treat conservatively given small size. We will start patient on antibiotics for both.  Recommended several daily warm sitz bath's and topical antibiotic.  Discussed my standard return precautions for worsening ear pain, drainage from the ear, or increased size of the abscess.  Otherwise follow-up with primary care doctor      _____________________________________________ Please note:  Patient was evaluated in Emergency Department today for the symptoms described in the history of present illness. Patient was evaluated in the context of the global COVID-19 pandemic, which necessitated consideration that the patient might be at risk for infection with the SARS-CoV-2 virus that causes COVID-19. Institutional protocols and algorithms that pertain to the evaluation of patients at risk  for COVID-19 are in a state of rapid change based on information released by regulatory bodies including the CDC and federal and state organizations. These policies and algorithms were followed during the patient's care in the ED.  Some ED evaluations and interventions may be delayed as a result of limited staffing during the pandemic.   [place] Controlled Substance Database was reviewed by me. ____________________________________________   FINAL CLINICAL IMPRESSION(S) / ED DIAGNOSES   Final diagnoses:  Viral upper respiratory tract infection  Non-recurrent acute suppurative otitis media of left ear without spontaneous rupture of tympanic membrane  Abscess      NEW MEDICATIONS STARTED DURING THIS VISIT:  ED Discharge Orders     None        Note:  This document was prepared using Dragon voice recognition software and may include unintentional dictation errors.    30, MD 09/19/21 [phone]

## 2022-10-30 ENCOUNTER — Emergency Department
Admission: EM | Admit: 2022-10-30 | Discharge: 2022-10-30 | Disposition: A | Discharge status: Home / Self Care | Payer: Medicaid Other | Attending: Emergency Medicine | Admitting: Emergency Medicine

## 2022-10-30 ENCOUNTER — Other Ambulatory Visit: Payer: Self-pay

## 2022-10-30 ENCOUNTER — Emergency Department: Payer: Medicaid Other

## 2022-10-30 DIAGNOSIS — J101 Influenza due to other identified influenza virus with other respiratory manifestations: Secondary | ICD-10-CM | POA: Insufficient documentation

## 2022-10-30 DIAGNOSIS — J111 Influenza due to unidentified influenza virus with other respiratory manifestations: Secondary | ICD-10-CM

## 2022-10-30 DIAGNOSIS — Z1152 Encounter for screening for COVID-19: Secondary | ICD-10-CM | POA: Insufficient documentation

## 2022-10-30 LAB — URINALYSIS, ROUTINE W REFLEX MICROSCOPIC
Bilirubin Urine: NEGATIVE
Glucose, UA: NEGATIVE mg/dL
Hgb urine dipstick: NEGATIVE
Ketones, ur: 20 mg/dL — AB
Leukocytes,Ua: NEGATIVE
Nitrite: NEGATIVE
Protein, ur: NEGATIVE mg/dL
Specific Gravity, Urine: 1.003 — ABNORMAL LOW (ref 1.005–1.030)
pH: 6 (ref 5.0–8.0)

## 2022-10-30 LAB — RESP PANEL BY RT-PCR (RSV, FLU A&B, COVID)  RVPGX2
Influenza A by PCR: NEGATIVE
Influenza B by PCR: POSITIVE — AB
Resp Syncytial Virus by PCR: NEGATIVE
SARS Coronavirus 2 by RT PCR: NEGATIVE

## 2022-10-30 LAB — GROUP A STREP BY PCR: Group A Strep by PCR: NOT DETECTED

## 2022-10-30 MED ORDER — BENZONATATE 100 MG PO CAPS: 100.0000 mg | ORAL_CAPSULE | Freq: Three times a day (TID) | ORAL | 0 refills | Status: AC | PRN

## 2022-10-30 NOTE — ED Notes (Signed)
Pt arrived with complaint of fever, body aches, chills, and cough. Pt states she has felt like this for a few days. Pt reports testing neg for covid and flu recently.   RN attempted to collect mono screen, pt refused testing at this time unless swabs come back negative.

## 2022-10-30 NOTE — ED Provider Notes (Signed)
[facility] Provider Note    Event Date/Time   First MD Initiated Contact with Patient 10/30/22 [phone]     (approximate)   History   Fever   HPI  Annette Ortiz is a 27 y.o. female with no reported past medical history presents today for evaluation of cough, runny nose, and bodyaches for the past several days.  She is unsure of any sick contacts.  She denies chest pain, shortness of breath, abdominal pain, nausea, vomiting, diarrhea.  She has not taken any antipyretics today.  She reports that it "feels warm" when she pees.  There are no problems to display for this patient.         Physical Exam   Triage Vital Signs: ED Triage Vitals [10/30/22 0858]  Enc Vitals Group     BP 128/83     Pulse Rate 95     Resp 18     Temp 98.7 F (37.1 C)     Temp Source Oral     SpO2 99 %     Weight      Height      Head Circumference      Peak Flow      Pain Score 6     Pain Loc      Pain Edu?      Excl. in [place]?     Most recent vital signs: Vitals:   10/30/22 0858  BP: 128/83  Pulse: 95  Resp: 18  Temp: 98.7 F (37.1 C)  SpO2: 99%    Physical Exam Vitals and nursing note reviewed.  Constitutional:      General: Awake and alert. No acute distress.    Appearance: Normal appearance. The patient is normal weight.  HENT:     Head: Normocephalic and atraumatic.     Mouth: Mucous membranes are moist. Uvula midline.  No tonsillar exudate.  No soft palate fluctuance.  No trismus.  No voice change.  No sublingual swelling.  No tender cervical lymphadenopathy.  No nuchal rigidity. Nasal congestion or rhinorrhea present Eyes:     General: PERRL. Normal EOMs        Right eye: No discharge.        Left eye: No discharge.     Conjunctiva/sclera: Conjunctivae normal.  Cardiovascular:     Rate and Rhythm: Normal rate and regular rhythm.     Pulses: Normal pulses.  Pulmonary:     Effort: Pulmonary effort is normal. No respiratory distress.     Breath  sounds: Normal breath sounds.  Abdominal:     Abdomen is soft. There is no abdominal tenderness. No rebound or guarding. No distention. Musculoskeletal:        General: No swelling. Normal range of motion.     Cervical back: Normal range of motion and neck supple.  Skin:    General: Skin is warm and dry.     Capillary Refill: Capillary refill takes less than 2 seconds.     Findings: No rash.  Neurological:     Mental Status: The patient is awake and alert.      ED Results / Procedures / Treatments   Labs (all labs ordered are listed, but only abnormal results are displayed) Labs Reviewed  RESP PANEL BY RT-PCR (RSV, FLU A&B, COVID)  RVPGX2 - Abnormal; Notable for the following components:      Result Value   Influenza B by PCR POSITIVE (*)    All other components within normal  limits  URINALYSIS, ROUTINE W REFLEX MICROSCOPIC - Abnormal; Notable for the following components:   Color, Urine STRAW (*)    APPearance CLEAR (*)    Specific Gravity, Urine 1.003 (*)    Ketones, ur 20 (*)    All other components within normal limits  GROUP A STREP BY PCR  MONONUCLEOSIS SCREEN     EKG     RADIOLOGY     PROCEDURES:  Critical Care performed:   Procedures   MEDICATIONS ORDERED IN ED: Medications - No data to display   IMPRESSION / MDM / Joyce / ED COURSE  I reviewed the triage vital signs and the nursing notes.   Differential diagnosis includes, but is not limited to, COVID, flu, RSV, strep pharyngitis, pneumonia, bronchitis.  Patient is awake and alert, hemodynamically stable and afebrile without any antipyretics.  She has rhinorrhea noted.  Her abdomen is soft and nontender, she demonstrates no increased work of breathing.  Chest x-ray demonstrates no acute cardiopulmonary abnormality or evidence of pneumonia.  Urinalysis is not indicative of infection.  She declined any blood work.  Swab is positive for influenza B.  We discussed this diagnosis at  length.  She requested cough medicine which was sent to her pharmacy.  No chest pain or fever to suggest myocarditis.  No shortness of breath or nausea.  We discussed strict return precautions and the importance of close outpatient follow-up.  Patient understands and agrees with plan.  She was discharged in stable condition.   Patient's presentation is most consistent with acute complicated illness / injury requiring diagnostic workup.     FINAL CLINICAL IMPRESSION(S) / ED DIAGNOSES   Final diagnoses:  Influenza     Rx / DC Orders   ED Discharge Orders          Ordered    benzonatate (TESSALON PERLES) 100 MG capsule  3 times daily PRN        10/30/22 1045             Note:  This document was prepared using Dragon voice recognition software and may include unintentional dictation errors.   Emeline Gins 10/30/22 1103    Harvest Dark, MD 10/30/22 1557

## 2022-10-30 NOTE — ED Notes (Signed)
Pt discharge to home. Pt VSS, GCS 15, NAD. Pt verbalized understanding of discharge instructions with no additional questions at this time.  

## 2022-10-30 NOTE — Discharge Instructions (Signed)
Your flu test is positive.  Please rest, hydrate, and you may take Tylenol per package instructions to help with your body aches and fever.  Please return for any new, worsening, or change in symptoms or other concerns.  It was a pleasure caring for you today.

## 2022-10-30 NOTE — ED Triage Notes (Signed)
Pt to ED via POV from home. Pt reports cough, congestion and fever. Pt reports fever 102 PTA. Pt was tested for COVID and Flu yesterday and was negative.

## 2023-10-30 ENCOUNTER — Other Ambulatory Visit: Payer: Self-pay

## 2023-10-30 ENCOUNTER — Emergency Department
Admission: EM | Admit: 2023-10-30 | Discharge: 2023-10-30 | Disposition: A | Discharge status: Home / Self Care | Payer: Self-pay | Attending: Emergency Medicine | Admitting: Emergency Medicine

## 2023-10-30 DIAGNOSIS — Z202 Contact with and (suspected) exposure to infections with a predominantly sexual mode of transmission: Secondary | ICD-10-CM | POA: Insufficient documentation

## 2023-10-30 LAB — CHLAMYDIA/NGC RT PCR (ARMC ONLY)
Chlamydia Tr: NOT DETECTED
N gonorrhoeae: NOT DETECTED

## 2023-10-30 LAB — WET PREP, GENITAL
Clue Cells Wet Prep HPF POC: NONE SEEN
Sperm: NONE SEEN
Trich, Wet Prep: NONE SEEN
WBC, Wet Prep HPF POC: <10 (ref <10)
Yeast Wet Prep HPF POC: NONE SEEN

## 2023-10-30 LAB — URINALYSIS, ROUTINE W REFLEX MICROSCOPIC
Bacteria, UA: NONE SEEN
Bilirubin Urine: NEGATIVE
Glucose, UA: NEGATIVE mg/dL
Ketones, ur: NEGATIVE mg/dL
Leukocytes,Ua: NEGATIVE
Nitrite: NEGATIVE
Protein, ur: NEGATIVE mg/dL
Specific Gravity, Urine: 1.024 (ref 1.005–1.030)
pH: 6 (ref 5.0–8.0)

## 2023-10-30 LAB — POC URINE PREG, ED: Preg Test, Ur: NEGATIVE

## 2023-10-30 NOTE — Discharge Instructions (Signed)
Please keep your phone close by as we will be reaching out to with results. If you test positive for anything, an antibiotic will be sent to your pharmacy.

## 2023-10-30 NOTE — ED Triage Notes (Signed)
Pt to ED for STI testing, boyfriend cheated on her and has chlamydia. Also pt being treated for UTI but stopped after 3 days because GI upset and would like to have abx changed. Denies other symptoms.

## 2023-10-30 NOTE — ED Provider Notes (Addendum)
[facility] Provider Note    Event Date/Time   First MD Initiated Contact with Patient 10/30/23 1523     (approximate)   History   STI testing and Recurrent UTI   HPI  Annette Ortiz is a 28 y.o. female with PMH of depression and anxiety presents for evaluation of possible recurrent UTI and STI testing.  Patient states she was taking an antibiotic for UTI but stopped taking it after 3 days because of GI upset.  She is also concerned about possible STD exposure as her sexual partner tested positive for chlamydia.  Patient has noticed a different odor to her vaginal discharge but has not had other symptoms.      Physical Exam   Triage Vital Signs: ED Triage Vitals  Encounter Vitals Group     BP 10/30/23 1221 110/66     Systolic BP Percentile --      Diastolic BP Percentile --      Pulse Rate 10/30/23 1221 67     Resp 10/30/23 1221 18     Temp 10/30/23 1221 98.2 F (36.8 C)     Temp Source 10/30/23 1221 Oral     SpO2 10/30/23 1221 100 %     Weight 10/30/23 1226 155 lb (70.3 kg)     Height 10/30/23 1226 5\' 8"  (1.727 m)     Head Circumference --      Peak Flow --      Pain Score 10/30/23 1225 0     Pain Loc --      Pain Education --      Exclude from Growth Chart --     Most recent vital signs: Vitals:   10/30/23 1221  BP: 110/66  Pulse: 67  Resp: 18  Temp: 98.2 F (36.8 C)  SpO2: 100%   General: Awake, no distress.  CV:  Good peripheral perfusion.  Resp:  Normal effort.  Abd:  No distention.  Other:     ED Results / Procedures / Treatments   Labs (all labs ordered are listed, but only abnormal results are displayed) Labs Reviewed  URINALYSIS, ROUTINE W REFLEX MICROSCOPIC - Abnormal; Notable for the following components:      Result Value   Color, Urine YELLOW (*)    APPearance HAZY (*)    Hgb urine dipstick LARGE (*)    All other components within normal limits  CHLAMYDIA/NGC RT PCR ([facility] ONLY)            WET PREP,  GENITAL  POC URINE PREG, ED    PROCEDURES:  Critical Care performed: No  Procedures   MEDICATIONS ORDERED IN ED: Medications - No data to display   IMPRESSION / MDM / ASSESSMENT AND PLAN / ED COURSE  I reviewed the triage vital signs and the nursing notes.                             28 year old female presents for evaluation of STI and UTI.  Vital signs are stable patient NAD on exam.  Differential diagnosis includes, but is not limited to, UTI, gonorrhea, chlamydia, yeast infection, BV, trichomonas, PID.  Patient's presentation is most consistent with acute complicated illness / injury requiring diagnostic workup.  Urinalysis unremarkable.  Pregnancy test is negative.  Wet prep and gonorrhea chlamydia test is pending.  Patient stated that she needs to get home as she is having problems with her babysitter.  I explained  that I would call her with results of her STD testing and would send an antibiotic for her if she tested positive.  Patient voiced understanding, all questions were answered and she was stable at discharge.    Clinical Course as of 10/30/23 1729  Sat Oct 30, 2023  1729 Chlamydia/NGC rt PCR [facility] only) Patient was contacted and informed about her negative results. [LD]    Clinical Course User Index [LD] Cameron Ali, PA-C     FINAL CLINICAL IMPRESSION(S) / ED DIAGNOSES   Final diagnoses:  STD exposure     Rx / DC Orders   ED Discharge Orders     None        Note:  This document was prepared using Dragon voice recognition software and may include unintentional dictation errors.   Cameron Ali, PA-C 10/30/23 1641    Cameron Ali, PA-C 10/30/23 1729    Trinna Post, MD 10/30/23 2021

## 2024-06-07 ENCOUNTER — Ambulatory Visit
Admission: RE | Admit: 2024-06-07 | Discharge: 2024-06-07 | Disposition: A | Discharge status: Home / Self Care | Payer: Self-pay | Source: Ambulatory Visit | Attending: Emergency Medicine | Admitting: Emergency Medicine

## 2024-06-07 VITALS — BP 103/70 | HR 73 | Temp 98.4°F | Resp 18

## 2024-06-07 DIAGNOSIS — R300 Dysuria: Secondary | ICD-10-CM | POA: Insufficient documentation

## 2024-06-07 DIAGNOSIS — N898 Other specified noninflammatory disorders of vagina: Secondary | ICD-10-CM | POA: Insufficient documentation

## 2024-06-07 LAB — POCT URINE DIPSTICK
Bilirubin, UA: NEGATIVE
Blood, UA: NEGATIVE
Glucose, UA: NEGATIVE mg/dL
Ketones, POC UA: NEGATIVE mg/dL
Leukocytes, UA: NEGATIVE
Nitrite, UA: NEGATIVE
POC PROTEIN,UA: NEGATIVE
Spec Grav, UA: 1.02 (ref 1.010–1.025)
Urobilinogen, UA: 0.2 U/dL
pH, UA: 7 (ref 5.0–8.0)

## 2024-06-07 MED ORDER — FLUCONAZOLE 150 MG PO TABS: 150.0000 mg | ORAL_TABLET | Freq: Every day | ORAL | 0 refills | Status: AC

## 2024-06-07 NOTE — ED Provider Notes (Signed)
 Annette Ortiz    CSN: [id number] Arrival date & time: 06/07/24  1120      History   Chief Complaint Chief Complaint  Patient presents with   Vaginal Itching    Uti like symptoms - Entered by patient    HPI Annette Ortiz is a 28 y.o. female.   Patient presents for evaluation of vaginal itching, clear vaginal discharge and dysuria present for 2 to 3 days.  Denies sexual activity.  Has attempted use of a boric acid which has been helpful.  Denies vaginal odor, urinary frequency, hematuria, back pain or abdominal pain.  Last menstrual period 05/30/2024.  Past Medical History:  Diagnosis Date   Anxiety    Depression     There are no active problems to display for this patient.   History reviewed. No pertinent surgical history.  OB History     Gravida  0   Para  0   Term  0   Preterm  0   AB  0   Living  0      SAB  0   IAB  0   Ectopic  0   Multiple  0   Live Births  0            Home Medications    Prior to Admission medications   Medication Sig Start Date End Date Taking? Authorizing Provider  fluconazole  (DIFLUCAN ) 150 MG tablet Take 1 tablet (150 mg total) by mouth daily for 2 doses. 06/07/24 06/09/24 Yes Hammad Finkler R, NP  acetaminophen  (TYLENOL ) 325 MG tablet Take 2 tablets (650 mg total) by mouth every 6 (six) hours as needed. 04/09/17   Cartner, Morene, PA-C  FLUoxetine (PROZAC) 20 MG capsule Take 20 mg by mouth daily.    [provider]  ibuprofen  (ADVIL ,MOTRIN ) 600 MG tablet Take 1 tablet (600 mg total) by mouth every 6 (six) hours as needed. 04/09/17   Cartner, Morene, PA-C  ondansetron  (ZOFRAN ) 8 MG tablet Take 1 tablet (8 mg total) by mouth every 8 (eight) hours as needed for nausea or vomiting. 04/11/17   Kirichenko, Tatyana, PA-C  traMADol  (ULTRAM ) 50 MG tablet Take 1 tablet (50 mg total) by mouth every 6 (six) hours as needed. 04/11/17   Kirichenko, Tatyana, PA-C    Family History Family History  Problem Relation  Age of Onset   Seizures Mother    Thyroid disease Father    Seizures Brother     Social History Social History   Tobacco Use   Smoking status: Never   Smokeless tobacco: Never  Vaping Use   Vaping status: Every Day  Substance Use Topics   Alcohol use: No   Drug use: No     Allergies   Patient has no known allergies.   Review of Systems Review of Systems   Physical Exam Triage Vital Signs ED Triage Vitals  Encounter Vitals Group     BP 06/07/24 1154 103/70     Girls Systolic BP Percentile --      Girls Diastolic BP Percentile --      Boys Systolic BP Percentile --      Boys Diastolic BP Percentile --      Pulse Rate 06/07/24 1154 73     Resp 06/07/24 1154 18     Temp 06/07/24 1154 98.4 F (36.9 C)     Temp Source 06/07/24 1154 Oral     SpO2 06/07/24 1154 99 %     Weight --  Height --      Head Circumference --      Peak Flow --      Pain Score 06/07/24 1151 0     Pain Loc --      Pain Education --      Exclude from Growth Chart --    No data found.  Updated Vital Signs BP 103/70 (BP Location: Left Arm)   Pulse 73   Temp 98.4 F (36.9 C) (Oral)   Resp 18   LMP 05/30/2024 (Exact Date)   SpO2 99%   Visual Acuity Right Eye Distance:   Left Eye Distance:   Bilateral Distance:    Right Eye Near:   Left Eye Near:    Bilateral Near:     Physical Exam Constitutional:      Appearance: Normal appearance.  Eyes:     Extraocular Movements: Extraocular movements intact.  Pulmonary:     Effort: Pulmonary effort is normal.  Abdominal:     Tenderness: There is no abdominal tenderness. There is no right CVA tenderness, left CVA tenderness or guarding.  Genitourinary:    Comments: deferred Neurological:     Mental Status: She is alert and oriented to person, place, and time. Mental status is at baseline.      UC Treatments / Results  Labs (all labs ordered are listed, but only abnormal results are displayed) Labs Reviewed  URINE CULTURE   POCT URINE DIPSTICK  CERVICOVAGINAL ANCILLARY ONLY    EKG   Radiology No results found.  Procedures Procedures (including critical care time)  Medications Ordered in UC Medications - No data to display  Initial Impression / Assessment and Plan / UC Course  I have reviewed the triage vital signs and the nursing notes.  Pertinent labs & imaging results that were available during my care of the patient were reviewed by me and considered in my medical decision making (see chart for details).   Vaginal discharge, dysuria  Urinalysis negative, sent for culture empirically treating for yeast, prescribed Diflucan  and discussed administration, recommended nonpharmacological measures, declined HIV and syphilis testing, STI labs pending will treat per protocol, advised abstinence until lab results, and/or treatment is complete, advised condom use during all sexual encounters moving, may follow-up with urgent care as needed  Final Clinical Impressions(s) / UC Diagnoses   Final diagnoses:  Vaginal discharge  Dysuria     Discharge Instructions      Today you are being treated  for yeast.   Urinalysis is negative for bladder infection, urine has been sent to the lab for 3 days to see bacterial growth, you will be notified if this occurs and started on antibiotics  Take diflucan  150 mg once, if symptoms still present in 3 days then you may take second pill   Yeast infections which are caused by a naturally occurring fungus called candida. Vaginosis is an inflammation of the vagina that can result in discharge, itching and pain. The cause is usually a change in the normal balance of vaginal bacteria or an infection. Vaginosis can also result from reduced estrogen levels after menopause.  Labs pending 2-3 days, you will be contacted if positive for any sti and treatment will be sent to the pharmacy, you will have to return to the clinic if positive for gonorrhea to receive treatment    Please refrain from having sex until labs results, if positive please refrain from having sex until treatment complete and symptoms resolve   If positive for, Chlamydia  gonorrhea or trichomoniasis please notify partner or partners so they may tested as well  Moving forward, it is recommended you use some form of protection against the transmission of sti infections  such as condoms or dental dams with each sexual encounter    In addition:   Avoid baths, hot tubs and whirlpool spas.  Don't use scented or harsh soaps, such as those with deodorant or antibacterial action. Avoid irritants. These include scented tampons and pads. Wipe from front to back after using the toilet.  Don't douche. Your vagina doesn't require cleansing other than normal bathing.  Use a  condom. Wear cotton underwear, this fabric helps absorb moisture     ED Prescriptions     Medication Sig Dispense Auth. Provider   fluconazole  (DIFLUCAN ) 150 MG tablet Take 1 tablet (150 mg total) by mouth daily for 2 doses. 2 tablet Mercy Leppla R, NP      PDMP not reviewed this encounter.   Teresa Shelba SAUNDERS, NP 06/07/24 [phone]

## 2024-06-07 NOTE — Discharge Instructions (Signed)
 Today you are being treated  for yeast.   Urinalysis is negative for bladder infection, urine has been sent to the lab for 3 days to see bacterial growth, you will be notified if this occurs and started on antibiotics  Take diflucan  150 mg once, if symptoms still present in 3 days then you may take second pill   Yeast infections which are caused by a naturally occurring fungus called candida. Vaginosis is an inflammation of the vagina that can result in discharge, itching and pain. The cause is usually a change in the normal balance of vaginal bacteria or an infection. Vaginosis can also result from reduced estrogen levels after menopause.  Labs pending 2-3 days, you will be contacted if positive for any sti and treatment will be sent to the pharmacy, you will have to return to the clinic if positive for gonorrhea to receive treatment   Please refrain from having sex until labs results, if positive please refrain from having sex until treatment complete and symptoms resolve   If positive for, Chlamydia  gonorrhea or trichomoniasis please notify partner or partners so they may tested as well  Moving forward, it is recommended you use some form of protection against the transmission of sti infections  such as condoms or dental dams with each sexual encounter    In addition:   Avoid baths, hot tubs and whirlpool spas.  Don't use scented or harsh soaps, such as those with deodorant or antibacterial action. Avoid irritants. These include scented tampons and pads. Wipe from front to back after using the toilet.  Don't douche. Your vagina doesn't require cleansing other than normal bathing.  Use a  condom. Wear cotton underwear, this fabric helps absorb moisture

## 2024-06-07 NOTE — ED Triage Notes (Signed)
 Patient reports vaginal itching with clear discharge. Patient states she feels like she is unable to empty her bladder completely. Denies pain.

## 2024-06-08 ENCOUNTER — Ambulatory Visit (HOSPITAL_COMMUNITY): Payer: Self-pay

## 2024-06-08 LAB — CERVICOVAGINAL ANCILLARY ONLY
Bacterial Vaginitis (gardnerella): NEGATIVE
Candida Glabrata: NEGATIVE
Candida Vaginitis: NEGATIVE
Chlamydia: NEGATIVE
Comment: NEGATIVE
Comment: NEGATIVE
Comment: NEGATIVE
Comment: NEGATIVE
Comment: NEGATIVE
Comment: NORMAL
Neisseria Gonorrhea: NEGATIVE
Trichomonas: NEGATIVE

## 2024-06-08 LAB — URINE CULTURE: Culture: NO GROWTH
